# Patient Record
Sex: Male | Born: 1970 | Race: White | Hispanic: No | Marital: Single | Smoking: Former smoker
Health system: Southern US, Community
[De-identification: ages and names within clinical notes are randomized; demographics above are authoritative.]

---

## 2013-11-15 ENCOUNTER — Emergency Department: Payer: Self-pay | Admitting: Emergency Medicine

## 2013-11-18 ENCOUNTER — Emergency Department: Payer: Self-pay | Admitting: Emergency Medicine

## 2013-11-18 LAB — CK TOTAL AND CKMB (NOT AT ARMC)
CK, Total: 220 U/L
CK-MB: 2.9 ng/mL (ref 0.5–3.6)

## 2013-11-18 LAB — COMPREHENSIVE METABOLIC PANEL
ANION GAP: 9 (ref 7–16)
AST: 30 U/L (ref 15–37)
Albumin: 3.4 g/dL (ref 3.4–5.0)
Alkaline Phosphatase: 84 U/L
BUN: 10 mg/dL (ref 7–18)
Bilirubin,Total: 0.3 mg/dL (ref 0.2–1.0)
CALCIUM: 8.2 mg/dL — AB (ref 8.5–10.1)
Chloride: 104 mmol/L (ref 98–107)
Co2: 26 mmol/L (ref 21–32)
Creatinine: 0.97 mg/dL (ref 0.60–1.30)
EGFR (Non-African Amer.): >60
Glucose: 129 mg/dL — ABNORMAL HIGH (ref 65–99)
OSMOLALITY: 278 (ref 275–301)
Potassium: 3.5 mmol/L (ref 3.5–5.1)
SGPT (ALT): 35 U/L
SODIUM: 139 mmol/L (ref 136–145)
TOTAL PROTEIN: 7.6 g/dL (ref 6.4–8.2)

## 2013-11-18 LAB — URINALYSIS, COMPLETE
BILIRUBIN, UR: NEGATIVE
Bacteria: NONE SEEN
Blood: NEGATIVE
Glucose,UR: 50 mg/dL (ref 0–75)
KETONE: NEGATIVE
LEUKOCYTE ESTERASE: NEGATIVE
NITRITE: NEGATIVE
PROTEIN: NEGATIVE
Ph: 7 (ref 4.5–8.0)
Specific Gravity: 1.011 (ref 1.003–1.030)
Squamous Epithelial: <1

## 2013-11-18 LAB — CBC
HCT: 45.3 % (ref 40.0–52.0)
HGB: 15 g/dL (ref 13.0–18.0)
MCH: 29.5 pg (ref 26.0–34.0)
MCHC: 33.1 g/dL (ref 32.0–36.0)
MCV: 89 fL (ref 80–100)
PLATELETS: 245 10*3/uL (ref 150–440)
RBC: 5.08 10*6/uL (ref 4.40–5.90)
RDW: 12.5 % (ref 11.5–14.5)
WBC: 6.8 10*3/uL (ref 3.8–10.6)

## 2013-11-18 LAB — TROPONIN I: Troponin-I: <0.02 ng/mL

## 2014-10-07 ENCOUNTER — Emergency Department
Admission: EM | Admit: 2014-10-07 | Discharge: 2014-10-07 | Disposition: A | Discharge status: Home / Self Care | Payer: Self-pay | Attending: Emergency Medicine | Admitting: Emergency Medicine

## 2014-10-07 ENCOUNTER — Encounter: Payer: Self-pay | Admitting: Emergency Medicine

## 2014-10-07 DIAGNOSIS — L732 Hidradenitis suppurativa: Secondary | ICD-10-CM | POA: Insufficient documentation

## 2014-10-07 MED ORDER — HYDROCODONE-ACETAMINOPHEN 5-325 MG PO TABS
2.0000 | ORAL_TABLET | Freq: Once | ORAL | Status: AC
  Administered 2014-10-07: 2 via ORAL

## 2014-10-07 MED ORDER — CLINDAMYCIN HCL 300 MG PO CAPS: 300.0000 mg | ORAL_CAPSULE | Freq: Three times a day (TID) | ORAL | Status: AC

## 2014-10-07 MED ORDER — CLINDAMYCIN HCL 150 MG PO CAPS
300.0000 mg | ORAL_CAPSULE | Freq: Once | ORAL | Status: AC
  Administered 2014-10-07: 300 mg via ORAL

## 2014-10-07 MED ORDER — LIDOCAINE-EPINEPHRINE 2 %-1:100000 IJ SOLN: 20.0000 mL | Freq: Once | INTRAMUSCULAR | Status: DC

## 2014-10-07 MED ORDER — HYDROCODONE-ACETAMINOPHEN 5-325 MG PO TABS
ORAL_TABLET | ORAL | Status: AC
  Filled 2014-10-07: qty 2

## 2014-10-07 MED ORDER — CLINDAMYCIN HCL 150 MG PO CAPS
ORAL_CAPSULE | ORAL | Status: AC
  Filled 2014-10-07: qty 2

## 2014-10-07 MED ORDER — IBUPROFEN 800 MG PO TABS
800.0000 mg | ORAL_TABLET | Freq: Once | ORAL | Status: AC
  Administered 2014-10-07: 800 mg via ORAL

## 2014-10-07 MED ORDER — LIDOCAINE-EPINEPHRINE (PF) 1 %-1:200000 IJ SOLN
INTRAMUSCULAR | Status: AC
  Filled 2014-10-07: qty 30

## 2014-10-07 MED ORDER — IBUPROFEN 800 MG PO TABS
ORAL_TABLET | ORAL | Status: AC
  Filled 2014-10-07: qty 1

## 2014-10-07 MED ORDER — IBUPROFEN 800 MG PO TABS: 800.0000 mg | ORAL_TABLET | Freq: Three times a day (TID) | ORAL | Status: DC | PRN

## 2014-10-07 MED ORDER — HYDROCODONE-ACETAMINOPHEN 5-325 MG PO TABS: 1.0000 | ORAL_TABLET | Freq: Four times a day (QID) | ORAL | Status: AC | PRN

## 2014-10-07 NOTE — ED Notes (Signed)
Pt to ed with c/o left axillary pain.  Pt states ? Abscess x 3 days.  Denies hx of same.

## 2014-10-07 NOTE — ED Provider Notes (Signed)
[facility] Emergency Department Provider Note  ____________________________________________  Time seen:1354  I have reviewed the triage vital signs and the nursing notes.   HISTORY  Chief Complaint Abscess    HPI Peter Garcia is a 44 y.o. male comes with a 3 day history of worsening left axillary abscess to try to pop at home with no success is a keep swelling it's red and inflamed is concerned of infection currently rates pain between 2 and 4 out of 10 and was he touches it which makes it worse relieved by just keeping his arm off of it denies fevers chills nausea vomiting is has no other complaints at this time and is here today for further evaluation and treatment   History reviewed. No pertinent past medical history.  There are no active problems to display for this patient.   History reviewed. No pertinent past surgical history.  Current Outpatient Rx  Name  Route  Sig  Dispense  Refill  . clindamycin (CLEOCIN) 300 MG capsule   Oral   Take 1 capsule (300 mg total) by mouth 3 (three) times daily.   30 capsule   0   . HYDROcodone-acetaminophen (NORCO) 5-325 MG per tablet   Oral   Take 1 tablet by mouth every 6 (six) hours as needed.   10 tablet   0   . ibuprofen (ADVIL,MOTRIN) 800 MG tablet   Oral   Take 1 tablet (800 mg total) by mouth every 8 (eight) hours as needed.   30 tablet   0     Allergies Review of patient's allergies indicates no known allergies.  History reviewed. No pertinent family history.  Social History History  Substance Use Topics  . Smoking status: Never Smoker   . Smokeless tobacco: Not on file  . Alcohol Use: Yes    Review of Systems Constitutional: No fever/chills Eyes: No visual changes. ENT: No sore throat. Cardiovascular: Denies chest pain. Respiratory: Denies shortness of breath. Gastrointestinal: No abdominal pain.  No nausea, no vomiting.  No diarrhea.  No constipation. Genitourinary: Negative  for dysuria. Musculoskeletal: Negative for back pain. Skin: Negative for rash. Neurological: Negative for headaches, focal weakness or numbness.  10-point ROS otherwise negative.  ____________________________________________   PHYSICAL EXAM:  VITAL SIGNS: ED Triage Vitals  Enc Vitals Group     BP 10/07/14 1355 151/103 mmHg     Pulse Rate 10/07/14 1355 87     Resp 10/07/14 1355 18     Temp 10/07/14 1355 98.3 F (36.8 C)     Temp Source 10/07/14 1355 Oral     SpO2 10/07/14 1355 97 %     Weight --      Height --      Head Cir --      Peak Flow --      Pain Score 10/07/14 1340 2     Pain Loc --      Pain Edu? --      Excl. in GC? --     Constitutional: Alert and oriented. Well appearing and in no acute distress. Eyes: Conjunctivae are normal. PERRL. EOMI. Head: Atraumatic. Nose: No congestion/rhinnorhea. Mouth/Throat: Mucous membranes are moist.  Oropharynx non-erythematous. Neck: No stridor.   Cardiovascular: Normal rate, regular rhythm. Grossly normal heart sounds.  Good peripheral circulation. Respiratory: Normal respiratory effort.  No retractions. Lungs CTAB. Gastrointestinal: Soft and nontender. No distention. No abdominal bruits. No CVA tenderness. Musculoskeletal: No lower extremity tenderness nor edema.  No joint effusions. Neurologic:  Normal speech and language. No gross focal neurologic deficits are appreciated. Speech is normal. No gait instability. Skin:   left axillary abscess fluctuant tender to touch surrounding erythema about 2 and half centimeters in diameter Psychiatric: Mood and affect are normal. Speech and behavior are normal.  ____________________________________________     PROCEDURES  Procedure(s) performed: Incision and drainage was performed of abscess to left axilla area was prepped with alcohol abuse 1% lidocaine with epinephrine approximately 2 cc a 1/2 cm incision was made across most fluctuant area of the abscess large amount of  purulent drainage area was probed manual force was applied there was a sebaceous component to the drainage was packed using quarter-inch iodoform gauze patient tolerated the procedure very well  Critical Care performed: No  ____________________________________________   INITIAL IMPRESSION / ASSESSMENT AND PLAN / ED COURSE  Pertinent labs & imaging results that were available during my care of the patient were reviewed by me and considered in my medical decision making (see chart for details).  Initial impression this patient left axillary abscess hidradenitis is to follow-up in 2 days for wound check take antibiotics as prescribed return if any acute concerns or worsening symptoms ____________________________________________   FINAL CLINICAL IMPRESSION(S) / ED DIAGNOSES  Final diagnoses:  Hidradenitis [name], PA-C 10/07/14 1454  [name], MD 10/07/14 [phone]

## 2014-10-07 NOTE — ED Notes (Signed)
Assessment per PA 

## 2014-10-07 NOTE — Discharge Instructions (Signed)
Hidradenitis Suppurativa, Sweat Gland Abscess Hidradenitis suppurativa is a long lasting (chronic), uncommon disease of the sweat glands. With this, boil-like lumps and scarring develop in the groin, some times under the arms (axillae), and under the breasts. It may also uncommonly occur behind the ears, in the crease of the buttocks, and around the genitals.  CAUSES  The cause is from a blocking of the sweat glands. They then become infected. It may cause drainage and odor. It is not contagious. So it cannot be given to someone else. It most often shows up in puberty (about 10 to 42 years of age). But it may happen much later. It is similar to acne which is a disease of the sweat glands. This condition is slightly more common in African-Americans and women. SYMPTOMS   Hidradenitis usually starts as one or more red, tender, swellings in the groin or under the arms (axilla).  Over a period of hours to days the lesions get larger. They often open to the skin surface, draining clear to yellow-colored fluid.  The infected area heals with scarring. DIAGNOSIS  Your caregiver makes this diagnosis by looking at you. Sometimes cultures (growing germs on plates in the lab) may be taken. This is to see what germ (bacterium) is causing the infection.  TREATMENT   Topical germ killing medicine applied to the skin (antibiotics) are the treatment of choice. Antibiotics taken by mouth (systemic) are sometimes needed when the condition is getting worse or is severe.  Avoid tight-fitting clothing which traps moisture in.  Dirt does not cause hidradenitis and it is not caused by poor hygiene.  Involved areas should be cleaned daily using an antibacterial soap. Some patients find that the liquid form of Lever 2000, applied to the involved areas as a lotion after bathing, can help reduce the odor related to this condition.  Sometimes surgery is needed to drain infected areas or remove scarred tissue. Removal of  large amounts of tissue is used only in severe cases.  Birth control pills may be helpful.  Oral retinoids (vitamin A derivatives) for 6 to 12 months which are effective for acne may also help this condition.  Weight loss will improve but not cure hidradenitis. It is made worse by being overweight. But the condition is not caused by being overweight.  This condition is more common in people who have had acne.  It may become worse under stress. There is no medical cure for hidradenitis. It can be controlled, but not cured. The condition usually continues for years with periods of getting worse and getting better (remission). Document Released: 11/10/2003 Document Revised: 06/20/2011 Document Reviewed: 06/28/2013 Exit[employer] Patient Information 2015 [employer]. This information is not intended to replace advice given to you by your health care provider. Make sure you discuss any questions you have with your health care provider.  

## 2015-05-23 ENCOUNTER — Encounter (HOSPITAL_COMMUNITY): Payer: Self-pay | Admitting: Emergency Medicine

## 2015-05-23 ENCOUNTER — Emergency Department (HOSPITAL_COMMUNITY): Payer: Self-pay

## 2015-05-23 ENCOUNTER — Emergency Department (HOSPITAL_COMMUNITY)
Admission: EM | Admit: 2015-05-23 | Discharge: 2015-05-23 | Disposition: A | Discharge status: Home / Self Care | Payer: Self-pay | Attending: Emergency Medicine | Admitting: Emergency Medicine

## 2015-05-23 DIAGNOSIS — S0990XA Unspecified injury of head, initial encounter: Secondary | ICD-10-CM | POA: Insufficient documentation

## 2015-05-23 DIAGNOSIS — R000 Tachycardia, unspecified: Secondary | ICD-10-CM | POA: Insufficient documentation

## 2015-05-23 DIAGNOSIS — Y998 Other external cause status: Secondary | ICD-10-CM | POA: Insufficient documentation

## 2015-05-23 DIAGNOSIS — Z23 Encounter for immunization: Secondary | ICD-10-CM | POA: Insufficient documentation

## 2015-05-23 DIAGNOSIS — Y9389 Activity, other specified: Secondary | ICD-10-CM | POA: Insufficient documentation

## 2015-05-23 DIAGNOSIS — Z792 Long term (current) use of antibiotics: Secondary | ICD-10-CM | POA: Insufficient documentation

## 2015-05-23 DIAGNOSIS — Y9289 Other specified places as the place of occurrence of the external cause: Secondary | ICD-10-CM | POA: Insufficient documentation

## 2015-05-23 MED ORDER — TETANUS-DIPHTH-ACELL PERTUSSIS 5-2.5-18.5 LF-MCG/0.5 IM SUSP
0.5000 mL | Freq: Once | INTRAMUSCULAR | Status: AC
  Administered 2015-05-23: 0.5 mL via INTRAMUSCULAR
  Filled 2015-05-23: qty 0.5

## 2015-05-23 MED ORDER — OXYCODONE-ACETAMINOPHEN 5-325 MG PO TABS
1.0000 | ORAL_TABLET | Freq: Once | ORAL | Status: AC
  Administered 2015-05-23: 1 via ORAL
  Filled 2015-05-23: qty 1

## 2015-05-23 MED ORDER — MORPHINE SULFATE (PF) 4 MG/ML IV SOLN
6.0000 mg | Freq: Once | INTRAVENOUS | Status: DC
  Filled 2015-05-23: qty 2

## 2015-05-23 MED ORDER — OXYCODONE HCL 5 MG PO TABS: 5.0000 mg | ORAL_TABLET | Freq: Two times a day (BID) | ORAL | Status: AC | PRN
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
## 2015-07-21 IMAGING — CR DG CHEST 1V
1 series · 1 of 1 positions shown · non-contrast
Comparison: None.

CLINICAL DATA: Chest pain

EXAM:
CHEST - 1 VIEW

[ap]
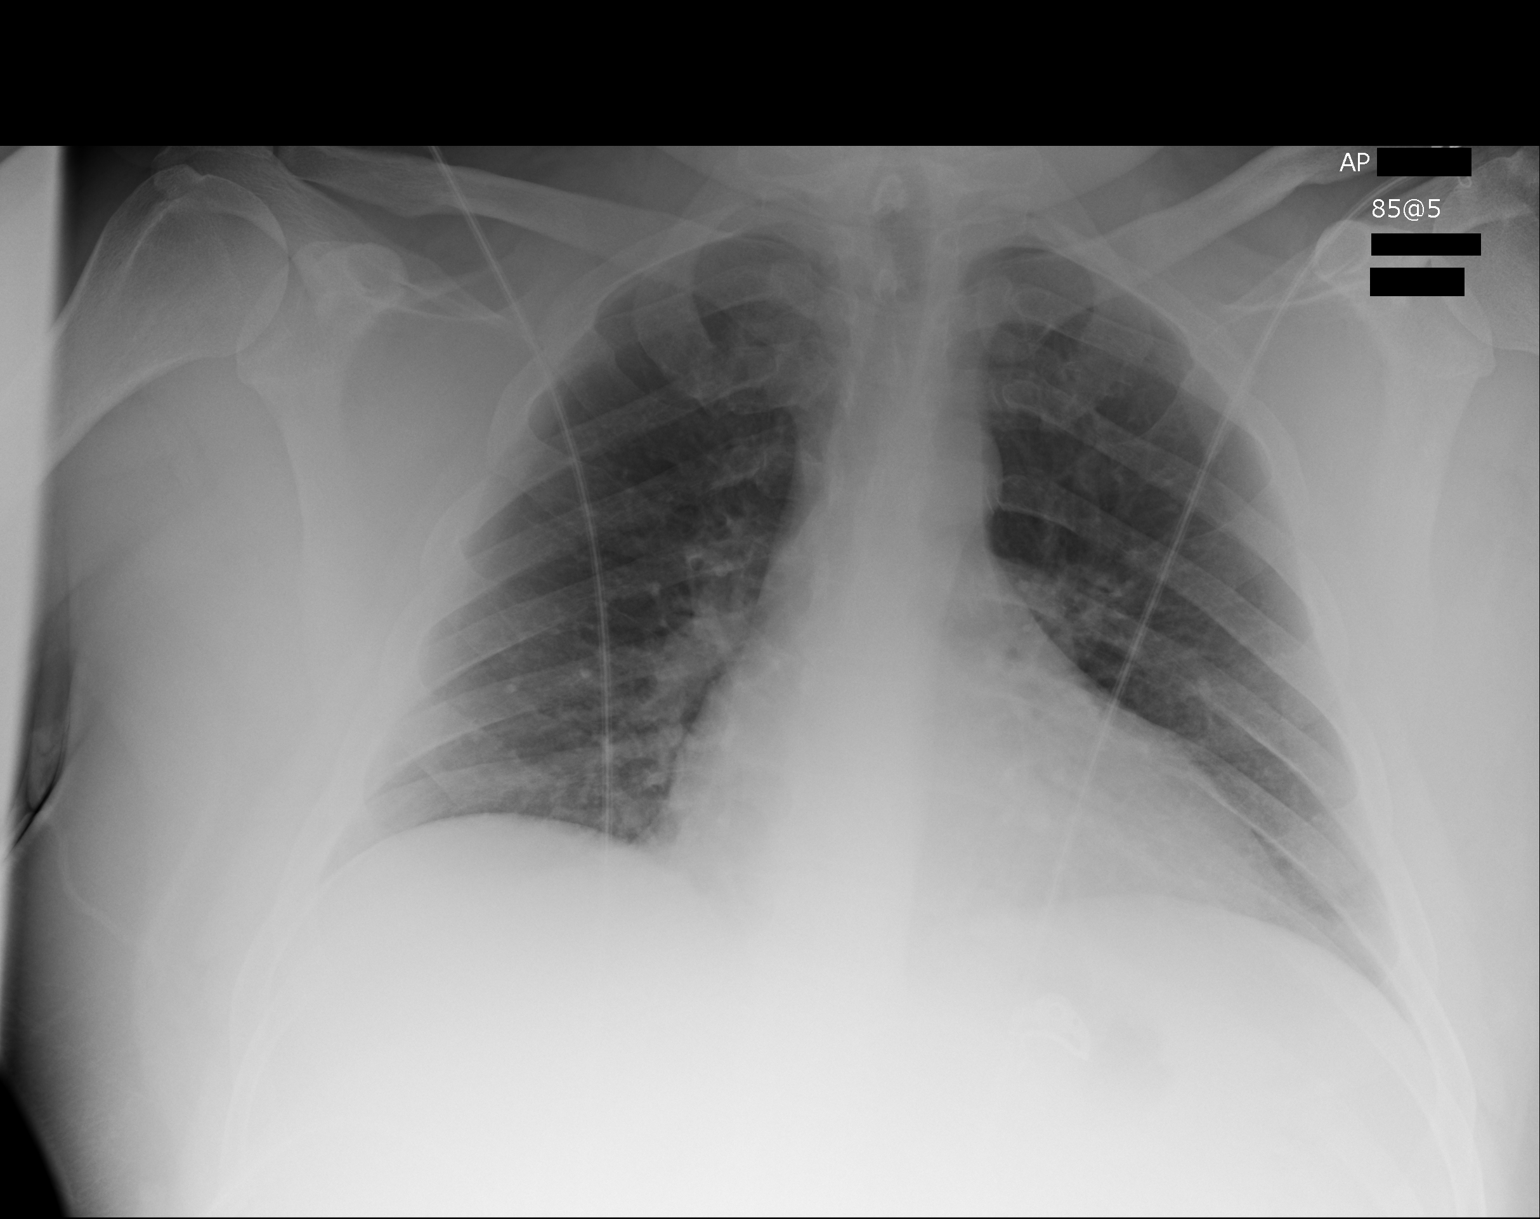

[1 of 1 positions shown; findings below may reference images not displayed]

FINDINGS: Lungs are clear. Heart is upper normal in size with pulmonary
vascularity within normal limits. No pneumothorax. No adenopathy. No
bone lesions.
IMPRESSION: No edema or consolidation.
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
## 2017-02-07 ENCOUNTER — Encounter (HOSPITAL_COMMUNITY): Payer: Self-pay | Admitting: *Deleted

## 2017-02-07 ENCOUNTER — Emergency Department (HOSPITAL_COMMUNITY)
Admission: EM | Admit: 2017-02-07 | Discharge: 2017-02-07 | Disposition: A | Discharge status: Home / Self Care | Payer: Self-pay | Attending: Emergency Medicine | Admitting: Emergency Medicine

## 2017-02-07 DIAGNOSIS — Y999 Unspecified external cause status: Secondary | ICD-10-CM | POA: Insufficient documentation

## 2017-02-07 DIAGNOSIS — Z79899 Other long term (current) drug therapy: Secondary | ICD-10-CM | POA: Insufficient documentation

## 2017-02-07 DIAGNOSIS — Y939 Activity, unspecified: Secondary | ICD-10-CM | POA: Insufficient documentation

## 2017-02-07 DIAGNOSIS — W010XXA Fall on same level from slipping, tripping and stumbling without subsequent striking against object, initial encounter: Secondary | ICD-10-CM | POA: Insufficient documentation

## 2017-02-07 DIAGNOSIS — Z87891 Personal history of nicotine dependence: Secondary | ICD-10-CM | POA: Insufficient documentation

## 2017-02-07 DIAGNOSIS — Y929 Unspecified place or not applicable: Secondary | ICD-10-CM | POA: Insufficient documentation

## 2017-02-07 DIAGNOSIS — S8011XA Contusion of right lower leg, initial encounter: Secondary | ICD-10-CM | POA: Insufficient documentation

## 2017-02-07 MED ORDER — IBUPROFEN 800 MG PO TABS: 800.0000 mg | ORAL_TABLET | Freq: Three times a day (TID) | ORAL | 0 refills | Status: AC

## 2017-02-07 NOTE — ED Triage Notes (Signed)
Pt c/o right lower leg pain after fall this morning. Pt tripped over something that made him fall, no dizziness or leg weakness upon fall. Pt c/o pain with ambulation. Pt has abrasion to right shin. Denies LOC upon fall.

## 2017-02-07 NOTE — Discharge Instructions (Signed)
Only wear the ace wrap for 2-3 hours at a time on/off.  Do not wear it to bed and do not wear it continuously.  Elevate and apply ice packs to your leg.  Return here for any worsening symptoms or follow-up with the orthopedic doctor listed

## 2017-02-09 NOTE — ED Provider Notes (Signed)
[facility] Provider [facility]   CSN: [id number] Arrival date & time: 02/07/17  [id number]     History   Chief Complaint Chief Complaint  Patient presents with  . Leg Injury    HPI Peter Garcia is a 46 y.o. male.  HPI  Peter Garcia is a 46 y.o. male who presents to the Emergency Department complaining of right lower leg pain secondary to a fall.  Reports that he tripped and struck his lower leg on concrete.  Complains of pain with flexion of his right foot and pain with weight bearing.  Injury occurred prior to arrival.  Denies head injury, LOC, neck or back pain, numbness or weakness and swelling or bleeding of his leg  History reviewed. No pertinent past medical history.  There are no active problems to display for this patient.   History reviewed. No pertinent surgical history.     Home Medications    Prior to Admission medications   Medication Sig Start Date End Date Taking? Authorizing Provider  clindamycin (CLEOCIN) 300 MG capsule Take 1 capsule (300 mg total) by mouth 3 (three) times daily. 10/07/14   Ruffian, III Kristine GarbeWilliam C, PA-C  HYDROcodone-acetaminophen (NORCO) 5-325 MG per tablet Take 1 tablet by mouth every 6 (six) hours as needed. 10/07/14   Ruffian, III Kristine GarbeWilliam C, PA-C  ibuprofen (ADVIL,MOTRIN) 800 MG tablet Take 1 tablet (800 mg total) by mouth 3 (three) times daily. Take with food 02/07/17   Karessa Onorato, PA-C  oxyCODONE (ROXICODONE) 5 MG immediate release tablet Take 1 tablet (5 mg total) by mouth 2 (two) times daily as needed for severe pain. 05/23/15   Tomasita Crumbleni, Adeleke, MD    Family History No family history on file.  Social History Social History  Substance Use Topics  . Smoking status: Former Smoker    Types: Cigarettes  . Smokeless tobacco: Current User    Types: Chew  . Alcohol use Yes     Comment: 6 pack weekly      Allergies   Patient has no known allergies.   Review of Systems Review of Systems  Constitutional: Negative  for chills and fever.  Musculoskeletal: Positive for arthralgias (right lower leg pain). Negative for back pain, joint swelling and neck pain.  Skin: Negative for color change and wound.  Neurological: Negative for weakness and numbness.  All other systems reviewed and are negative.    Physical Exam Updated Vital Signs BP (!) 146/101 (BP Location: Left Arm)   Pulse 79   Temp 98.7 F (37.1 C) (Oral)   Resp 18   Ht 5\' 4"  (1.626 m)   Wt 95.3 kg (210 lb)   SpO2 98%   BMI 36.05 kg/m   Physical Exam  Constitutional: He is oriented to person, place, and time. He appears well-developed and well-nourished. No distress.  HENT:  Head: Atraumatic.  Neck: Normal range of motion.  Cardiovascular: Normal rate, regular rhythm and intact distal pulses.   No murmur heard. Pulmonary/Chest: Effort normal and breath sounds normal. No respiratory distress.  Musculoskeletal: Normal range of motion. He exhibits tenderness. He exhibits no deformity.  ttp of the anterior right lower leg.  No edema.  No posterior leg pain or swelling.  Compartments soft  Neurological: He is alert and oriented to person, place, and time. No sensory deficit.  Skin: Skin is warm. Capillary refill takes less than 2 seconds.  superficial abrasion to right lower leg. No bleeding  Nursing note and vitals reviewed.  ED Treatments / Results  Labs (all labs ordered are listed, but only abnormal results are displayed) Labs Reviewed - No data to display  EKG  EKG Interpretation None       Radiology No results found.  Procedures Procedures (including critical care time)  Medications Ordered in ED Medications - No data to display   Initial Impression / Assessment and Plan / ED Course  I have reviewed the triage vital signs and the nursing notes.  Pertinent labs & imaging results that were available during my care of the patient were reviewed by me and considered in my medical decision making (see chart for  details).     Pt with likely contusion.  NV intact.  Compartments soft.  Low clinical suspicion for fx.  Pt agrees to ice, elevate and return precaution discussed.  Pt is ambulatory  Final Clinical Impressions(s) / ED Diagnoses   Final diagnoses:  Contusion of right lower leg, initial encounter    New Prescriptions Discharge Medication List as of 02/07/2017  9:03 AM       Pauline Aus, PA-C 02/09/17 2215    Samuel Jester, DO 02/12/17 1205

## 2022-02-08 ENCOUNTER — Ambulatory Visit (HOSPITAL_COMMUNITY): Payer: Medicaid Other | Admitting: Psychiatry

## 2022-03-21 ENCOUNTER — Ambulatory Visit: Payer: Medicaid Other | Admitting: Diagnostic Neuroimaging

## 2022-03-21 ENCOUNTER — Encounter: Payer: Self-pay | Admitting: Diagnostic Neuroimaging

## 2022-10-10 DEATH — deceased
